# Patient Record
Sex: Male | Born: 1985 | Hispanic: Yes | Smoking: Never smoker
Health system: Southern US, Community
[De-identification: ages and names within clinical notes are randomized; demographics above are authoritative.]

---

## 2014-03-26 ENCOUNTER — Encounter (HOSPITAL_COMMUNITY): Payer: Self-pay | Admitting: Family Medicine

## 2014-03-26 ENCOUNTER — Emergency Department (HOSPITAL_COMMUNITY)
Admission: EM | Admit: 2014-03-26 | Discharge: 2014-03-26 | Disposition: A | Discharge status: Home / Self Care | Payer: Self-pay | Attending: Emergency Medicine | Admitting: Emergency Medicine

## 2014-03-26 ENCOUNTER — Emergency Department (HOSPITAL_COMMUNITY): Payer: Self-pay

## 2014-03-26 DIAGNOSIS — R079 Chest pain, unspecified: Secondary | ICD-10-CM

## 2014-03-26 DIAGNOSIS — R0789 Other chest pain: Secondary | ICD-10-CM | POA: Insufficient documentation

## 2014-03-26 LAB — BASIC METABOLIC PANEL
Anion gap: 11 (ref 5–15)
BUN: 21 mg/dL (ref 6–23)
CO2: 25 mmol/L (ref 19–32)
Calcium: 9.3 mg/dL (ref 8.4–10.5)
Chloride: 103 mmol/L (ref 96–112)
Creatinine, Ser: 1.51 mg/dL — ABNORMAL HIGH (ref 0.50–1.35)
GFR calc Af Amer: 71 mL/min — ABNORMAL LOW (ref >90)
GFR, EST NON AFRICAN AMERICAN: 61 mL/min — AB (ref >90)
Glucose, Bld: 103 mg/dL — ABNORMAL HIGH (ref 70–99)
POTASSIUM: 4 mmol/L (ref 3.5–5.1)
SODIUM: 139 mmol/L (ref 135–145)

## 2014-03-26 LAB — CBC
HEMATOCRIT: 43.5 % (ref 39.0–52.0)
Hemoglobin: 15.3 g/dL (ref 13.0–17.0)
MCH: 28.3 pg (ref 26.0–34.0)
MCHC: 35.2 g/dL (ref 30.0–36.0)
MCV: 80.4 fL (ref 78.0–100.0)
PLATELETS: 337 10*3/uL (ref 150–400)
RBC: 5.41 MIL/uL (ref 4.22–5.81)
RDW: 13 % (ref 11.5–15.5)
WBC: 10.2 10*3/uL (ref 4.0–10.5)

## 2014-03-26 LAB — I-STAT TROPONIN, ED: Troponin i, poc: 0 ng/mL (ref 0.00–0.08)

## 2014-03-26 MED ORDER — OXYCODONE-ACETAMINOPHEN 5-325 MG PO TABS: 1.0000 | ORAL_TABLET | Freq: Four times a day (QID) | ORAL | Status: AC | PRN

## 2014-03-26 MED ORDER — OXYCODONE-ACETAMINOPHEN 5-325 MG PO TABS
1.0000 | ORAL_TABLET | Freq: Once | ORAL | Status: AC
  Administered 2014-03-26: 1 via ORAL
  Filled 2014-03-26: qty 1

## 2014-03-26 NOTE — Discharge Instructions (Signed)

## 2014-03-26 NOTE — ED Provider Notes (Signed)
CSN: [id number]     Arrival date & time 03/26/14  1706 History   First MD Initiated Contact with Patient 03/26/14 1927     Chief Complaint  Patient presents with  . Chest Pain     (Consider location/radiation/quality/duration/timing/severity/associated sxs/prior Treatment) Patient is a 29 y.o. male presenting with chest pain. The history is provided by the patient.  Chest Pain Pain location:  L chest, R chest and substernal area Pain quality: aching   Pain radiates to:  Does not radiate Pain radiates to the back: no   Pain severity:  Moderate Onset quality:  Gradual Duration:  2 days Timing:  Intermittent Progression:  Unchanged Chronicity:  New Context: at rest   Relieved by: ibuprofen. Worsened by:  Certain positions Ineffective treatments:  None tried Associated symptoms: no abdominal pain, no cough, no fever, no headache, no nausea, no numbness and not vomiting     History reviewed. No pertinent past medical history. History reviewed. No pertinent past surgical history. History reviewed. No pertinent family history. History  Substance Use Topics  . Smoking status: Never Smoker   . Smokeless tobacco: Not on file  . Alcohol Use: No    Review of Systems  Constitutional: Negative for fever.  HENT: Negative for drooling and rhinorrhea.   Eyes: Negative for pain.  Respiratory: Negative for cough.   Cardiovascular: Positive for chest pain. Negative for leg swelling.  Gastrointestinal: Negative for nausea, vomiting, abdominal pain and diarrhea.  Genitourinary: Negative for dysuria and hematuria.  Musculoskeletal: Negative for gait problem and neck pain.  Skin: Negative for color change.  Neurological: Negative for numbness and headaches.  Hematological: Negative for adenopathy.  Psychiatric/Behavioral: Negative for behavioral problems.  All other systems reviewed and are negative.     Allergies  Review of patient's allergies indicates no known allergies.  Home  Medications   Prior to Admission medications   Not on File   BP 145/85 mmHg  Pulse 87  Temp(Src) 97.9 F (36.6 C) (Oral)  Resp 21  SpO2 99% Physical Exam  Constitutional: He is oriented to person, place, and time. He appears well-developed and well-nourished.  HENT:  Head: Normocephalic and atraumatic.  Right Ear: External ear normal.  Left Ear: External ear normal.  Nose: Nose normal.  Mouth/Throat: Oropharynx is clear and moist. No oropharyngeal exudate.  Eyes: Conjunctivae and EOM are normal. Pupils are equal, round, and reactive to light.  Neck: Normal range of motion. Neck supple.  Cardiovascular: Normal rate, regular rhythm, normal heart sounds and intact distal pulses.  Exam reveals no gallop and no friction rub.   No murmur heard. Pulmonary/Chest: Effort normal and breath sounds normal. No respiratory distress. He has no wheezes. He exhibits tenderness (mild tenderness to palpation of the right upper chest. Moderate tenderness to palpation of the sternum.).  Abdominal: Soft. Bowel sounds are normal. He exhibits no distension. There is no tenderness. There is no rebound and no guarding.  Musculoskeletal: Normal range of motion. He exhibits no edema or tenderness.  Normal-appearing symmetric lower extremities.  2+ distal and equal pulses in the upper extremities.  Neurological: He is alert and oriented to person, place, and time.  Skin: Skin is warm and dry.  Psychiatric: He has a normal mood and affect. His behavior is normal.  Nursing note and vitals reviewed.   ED Course  Procedures (including critical care time) Labs Review Labs Reviewed  BASIC METABOLIC PANEL - Abnormal; Notable for the following:    Glucose, Bld 103 (*)  Creatinine, Ser 1.51 (*)    GFR calc non Af Amer 61 (*)    GFR calc Af Amer 71 (*)    All other components within normal limits  CBC  I-STAT TROPOININ, ED    Imaging Review Dg Chest 2 View  03/26/2014   CLINICAL DATA:  Chest pain.  Bilateral shoulder pain. Symptoms for 2 days.  EXAM: CHEST  2 VIEW  COMPARISON:  None.  FINDINGS: Cardiopericardial silhouette within normal limits. Mediastinal contours normal. The RIGHT lung is clear. There is streaky density at the LEFT lung base. This is favored to represent atelectasis over airspace disease. No pneumothorax. No pleural fluid.  IMPRESSION: LEFT basilar density favored to represent atelectasis over airspace disease/ pneumonia.   Electronically Signed   By: Andreas Newport M.D.   On: 03/26/2014 17:41     EKG Interpretation   Date/Time:  Friday March 26 2014 17:09:59 EST Ventricular Rate:  96 PR Interval:  140 QRS Duration: 76 QT Interval:  314 QTC Calculation: 396 R Axis:   42 Text Interpretation:  Normal sinus rhythm non-spec t wave abn's Confirmed  by Laddie Math  MD, Aleysia Oltmann ([id number]) on 03/26/2014 7:41:45 PM      MDM   Final diagnoses:  Atypical chest pain    8:17 PM 29 y.o. male who presents with chest pain which began 2 days ago. He states it began in his right chest and moved to the left side and in the middle. He states that last for hours at a time and feels like an aching pain. He states that it is worse with bending over. He states that he feels short of breath when the pain gets worse. He had relief with ibuprofen last night. He denies any injuries. Vital signs unremarkable here. I interpreted/reviewed the labs and/or imaging which were non-contributory.  Wells/perc neg. Likely msk cause of pain as it is reproducible w/ bending.   8:54 PM:  I have discussed the diagnosis/risks/treatment options with the patient and believe the pt to be eligible for discharge home to follow-up with at the wellness center as needed. We also discussed returning to the ED immediately if new or worsening sx occur. We discussed the sx which are most concerning (e.g., worsening pain, sob) that necessitate immediate return. Medications administered to the patient during their visit and any  new prescriptions provided to the patient are listed below.  Medications given during this visit Medications  oxyCODONE-acetaminophen (PERCOCET/ROXICET) 5-325 MG per tablet 1 tablet (1 tablet Oral Given 03/26/14 2029)    New Prescriptions   OXYCODONE-ACETAMINOPHEN (PERCOCET) 5-325 MG PER TABLET    Take 1 tablet by mouth every 6 (six) hours as needed for moderate pain.       Purvis Sheffield, MD 03/26/14 2054

## 2014-03-26 NOTE — ED Notes (Signed)
Pt sts right sided chest pain that radiated to left side. sts started last night. sts hurts with breathing and movement.

## 2015-01-19 ENCOUNTER — Emergency Department (HOSPITAL_COMMUNITY): Payer: Self-pay

## 2015-01-19 ENCOUNTER — Encounter (HOSPITAL_COMMUNITY): Payer: Self-pay | Admitting: *Deleted

## 2015-01-19 ENCOUNTER — Emergency Department (HOSPITAL_COMMUNITY)
Admission: EM | Admit: 2015-01-19 | Discharge: 2015-01-19 | Disposition: A | Discharge status: Home / Self Care | Payer: Self-pay | Attending: Emergency Medicine | Admitting: Emergency Medicine

## 2015-01-19 DIAGNOSIS — M25462 Effusion, left knee: Secondary | ICD-10-CM | POA: Insufficient documentation

## 2015-01-19 DIAGNOSIS — M25562 Pain in left knee: Secondary | ICD-10-CM | POA: Insufficient documentation

## 2015-01-19 MED ORDER — TYLENOL 325 MG PO TABS: 650.0000 mg | ORAL_TABLET | Freq: Four times a day (QID) | ORAL | Status: AC | PRN

## 2015-01-19 MED ORDER — KETOROLAC TROMETHAMINE 30 MG/ML IJ SOLN
30.0000 mg | Freq: Once | INTRAMUSCULAR | Status: AC
  Administered 2015-01-19: 30 mg via INTRAMUSCULAR
  Filled 2015-01-19: qty 1

## 2015-01-19 MED ORDER — NAPROXEN 500 MG PO TABS: 500.0000 mg | ORAL_TABLET | Freq: Two times a day (BID) | ORAL | Status: AC

## 2015-01-19 MED ORDER — ACETAMINOPHEN 500 MG PO TABS
1000.0000 mg | ORAL_TABLET | Freq: Once | ORAL | Status: AC
  Administered 2015-01-19: 1000 mg via ORAL
  Filled 2015-01-19: qty 2

## 2015-01-19 NOTE — ED Provider Notes (Signed)
CSN: [id number]     Arrival date & time 01/19/15  1317 History  By signing my name below, I, Christopher Conway, attest that this documentation has been prepared under the direction and in the presence of Tanesia Butner, [place]. Electronically Signed: Elon Conway ED Scribe. 01/19/2015. 2:37 PM.    Chief Complaint  Patient presents with  . Knee Pain    The history is provided by the patient. No language interpreter was used.   HPI Comments: Christopher Conway is a 29 y.o. male who presents to the Emergency Department complaining of intermittent, long-term, anterior left knee pain that worsened and became constant 1 week ago without injury.  The pain is worse with extending the knee and worse in the morning.  He treated the pain initially with ibuprofen with transient relief but this treatment has become ineffective over the past week.  He has also used a knee brace with some relief.  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  He denies fever, numbness/tingling, weakness.  Denies acute injury or trauma.   History reviewed. No pertinent past medical history. History reviewed. No pertinent past surgical history. No family history on file. Social History  Substance Use Topics  . Smoking status: Never Smoker   . Smokeless tobacco: None  . Alcohol Use: No    Review of Systems  10 Systems reviewed and all are negative for acute change except as noted in the HPI.     Allergies  Review of patient's allergies indicates no known allergies.  Home Medications   Prior to Admission medications   Medication Sig Start Date End Date Taking? Authorizing Provider  oxyCODONE-acetaminophen (PERCOCET) 5-325 MG per tablet Take 1 tablet by mouth every 6 (six) hours as needed for moderate pain. 03/26/14   Purvis Sheffield, MD   BP 143/94 mmHg  Pulse 67  Temp(Src) 97.7 F (36.5 C) (Oral)  Resp 18  SpO2 100% Physical Exam  Constitutional: He is oriented to person,  place, and time. He appears well-developed and well-nourished. No distress.  HENT:  Head: Normocephalic and atraumatic.  Eyes: Conjunctivae and EOM are normal.  Neck: Neck supple. No tracheal deviation present.  Cardiovascular: Normal rate.   Pulmonary/Chest: Effort normal. No respiratory distress.  Musculoskeletal:  L knee is mildly edematous when compared to R knee though it is not red or hot. FROM. Strength and sensation intact. There is a small point of tenderness just superior to tibial tuberosity. No visible lesion or deformity. Pt is able to ambulate unassisted with steady gait.  Neurological: He is alert and oriented to person, place, and time.  Skin: Skin is warm and dry.  Psychiatric: He has a normal mood and affect. His behavior is normal.  Nursing note and vitals reviewed.   ED Course  Procedures (including critical care time)  DIAGNOSTIC STUDIES: Oxygen Saturation is 100% on RA, normal by my interpretation.    COORDINATION OF CARE:  2:37 PM Discussed treatment plan with patient at bedside.  Patient acknowledges and agrees with plan.    Labs Review Labs Reviewed - No data to display  Imaging Review Dg Knee Complete 4 Views Left  01/19/2015  CLINICAL DATA:  Left posterior knee pain radiating around to anterior knee. No known injury. EXAM: LEFT KNEE - COMPLETE 4+ VIEW COMPARISON:  None. FINDINGS: Small joint effusion. No acute bony abnormality. Specifically, no fracture, subluxation, or dislocation. Soft tissues are intact. IMPRESSION: No bony abnormality.  Small joint effusion. Electronically Signed   By: Charlett NoseKevin  Dover M.D.   On: 01/19/2015 14:29   I have personally reviewed and evaluated these images and lab results as part of my medical decision-making.   EKG Interpretation None      MDM   Final diagnoses:  Left knee pain    XR shows small joint effusion but is otherwise unremarkable. Nonfocal exam. I suspect overuse injury. He is afebrile and joint is not  hot or red. I do not suspect septic joint. Exam is not consistent with--and pt does not have history to support--acute injury such as ACL or meniscal tear. Toradol and acetaminophen given here. Will give rx for NSAIDs and acetaminophen. Will give work note. Referral to ortho given for further evaluation if symptoms persist or do not improve. ER return precautions given .  I personally performed the services described in this documentation, which was scribed in my presence. The recorded information has been reviewed and is accurate.   Carlene CoriaSerena Y Karisma Meiser, PA-C 01/19/15 2026  Bethann BerkshireJoseph Zammit, MD 01/20/15 [phone]

## 2015-01-19 NOTE — Discharge Instructions (Signed)
Your knee pain is likely an overuse injury. You may continue using your brace as needed. I will give you a work note for the next two days and give you prescriptions for some pain medicine. Please follow up with ortho within one week if your symptoms do not improve.   Take medications as prescribed. Return to the emergency room for worsening condition or new concerning symptoms. Follow up with your regular doctor. If you don't have a regular doctor use one of the numbers below to establish a primary care doctor.   Emergency Department Resource Guide 1) Find a Doctor and Pay Out of Pocket Although you won't have to find out who is covered by your insurance plan, it is a good idea to ask around and get recommendations. You will then need to call the office and see if the doctor you have chosen will accept you as a new patient and what types of options they offer for patients who are self-pay. Some doctors offer discounts or will set up payment plans for their patients who do not have insurance, but you will need to ask so you aren't surprised when you get to your appointment.  2) Contact Your Local Health Department Not all health departments have doctors that can see patients for sick visits, but many do, so it is worth a call to see if yours does. If you don't know where your local health department is, you can check in your phone book. The CDC also has a tool to help you locate your state's health department, and many state websites also have listings of all of their local health departments.  3) Find a Walk-in Clinic If your illness is not likely to be very severe or complicated, you may want to try a walk in clinic. These are popping up all over the country in pharmacies, drugstores, and shopping centers. They're usually staffed by nurse practitioners or physician assistants that have been trained to treat common illnesses and complaints. They're usually fairly quick and inexpensive. However, if you  have serious medical issues or chronic medical problems, these are probably not your best option.  No Primary Care Doctor: - Call Health Connect at  [phone] - they can help you locate a primary care doctor that  accepts your insurance, provides certain services, etc. - Physician Referral [phone]  Emergency Department Resource Guide 1) Find a Doctor and Pay Out of Pocket Although you won't have to find out who is covered by your insurance plan, it is a good idea to ask around and get recommendations. You will then need to call the office and see if the doctor you have chosen will accept you as a new patient and what types of options they offer for patients who are self-pay. Some doctors offer discounts or will set up payment plans for their patients who do not have insurance, but you will need to ask so you aren't surprised when you get to your appointment.  2) Contact Your Local Health Department Not all health departments have doctors that can see patients for sick visits, but many do, so it is worth a call to see if yours does. If you don't know where your local health department is, you can check in your phone book. The CDC also has a tool to help you locate your state's health department, and many state websites also have listings of all of their local health departments.  3) Find a Walk-in Clinic If your illness is not  likely to be very severe or complicated, you may want to try a walk in clinic. These are popping up all over the country in pharmacies, drugstores, and shopping centers. They're usually staffed by nurse practitioners or physician assistants that have been trained to treat common illnesses and complaints. They're usually fairly quick and inexpensive. However, if you have serious medical issues or chronic medical problems, these are probably not your best option.  No Primary Care Doctor: - Call Health Connect at  [phone] - they can help you locate a primary care  doctor that  accepts your insurance, provides certain services, etc. - Physician Referral Service- [phone]  Chronic Pain Problems: Organization         Address  Phone   Notes  [facility]  [phone] Patients need to be referred by their primary care doctor.   Medication Assistance: Organization         Address  Phone   Notes  [facility] Medication [facility] [address] [place] [place], [place] [address] [phone] --Must be a resident of [facility] -- Must have NO insurance coverage whatsoever (no Medicaid/ Medicare, etc.) -- The pt. MUST have a primary care doctor that directs their care regularly and follows them in the community   [facility]  [phone]   [employer]  [phone]    Agencies that provide inexpensive medical care: Organization         Address  Phone   Notes  [facility]  [phone]   [facility]    [phone]   [facility] [address] [place], [place] [address] [phone]   [facility] [address] [place], [place] [phone]   [facility]    [phone]   [facility]    [phone]   [facility]  [address], [place] Phone:  [phone], Fax:  [phone] Hours of Operation:  9 am - 6 pm, M-F.  Also accepts Medicaid/Medicare and self-pay.  [facility]  [address], [place] Phone: [phone], Fax: [phone]. Hours of Operation:  8:30 am - 5:30 pm, M-F.  Also accepts Medicaid and self-pay.  [facility] [place] [address] [place] Phone: [phone]   [facility] [address] Natasha Bence [place], [place] [phone], Ext. [phone] Mondays & Thursdays: 7-9 AM.  First 15 patients are seen on a first come, first serve basis.    Medicaid-accepting [facility]  Providers:  Organization         Address  Phone   Notes  [facility] [address], [place] [phone] Also accepts self-pay patients.  [facility] [address] Laurell Josephs [place], [place]  [phone]   [facility] [address], [place] [phone]   [facility] [address] [place] [phone]   Renaye Rakers [address], [place]   [phone] Only accepts [place] patients after they have their name applied to their card.   Self-Pay (no insurance) in [facility]:  Organization         Address  Phone   Notes  Sickle Cell Patients, [facility] [address] [place], [place] [phone]   [facility]  [facility] [address], [place] [phone]   [facility]  [address], [place] [phone]) [phone]   [facility]/Dr. Osei-Bonsu  [address] [place] [address], [place] or [phone], [place] [phone] Phone number for both [place] and [place] locations is the same.  [facility] [address], [place] [phone]   [facility] [address] [place] [address], [place] or [address] [phone]   [facility] [address], [place] [phone], phone; [phone]) [phone], fax Sees patients 1st and 3rd Saturday of every month.  Must not qualify for public or private insurance (i.e. Medicaid, Medicare, [place] Health Choice, Veterans' Benefits)  Household income should be no more than 200% of the poverty level The clinic cannot treat you if you are pregnant or think you are pregnant  Sexually transmitted diseases are not treated at the clinic.

## 2015-01-19 NOTE — ED Notes (Signed)
Pt states about one week started having left posterior knee pain and now in front of knee, worse in am.  Pulse present.  No injury.

## 2017-01-01 IMAGING — CR DG CHEST 2V
2 series · 2 of 2 positions shown · non-contrast
Comparison: None.

CLINICAL DATA: Chest pain. Bilateral shoulder pain. Symptoms for 2
days.

EXAM:
CHEST  2 VIEW

[w chest pa]
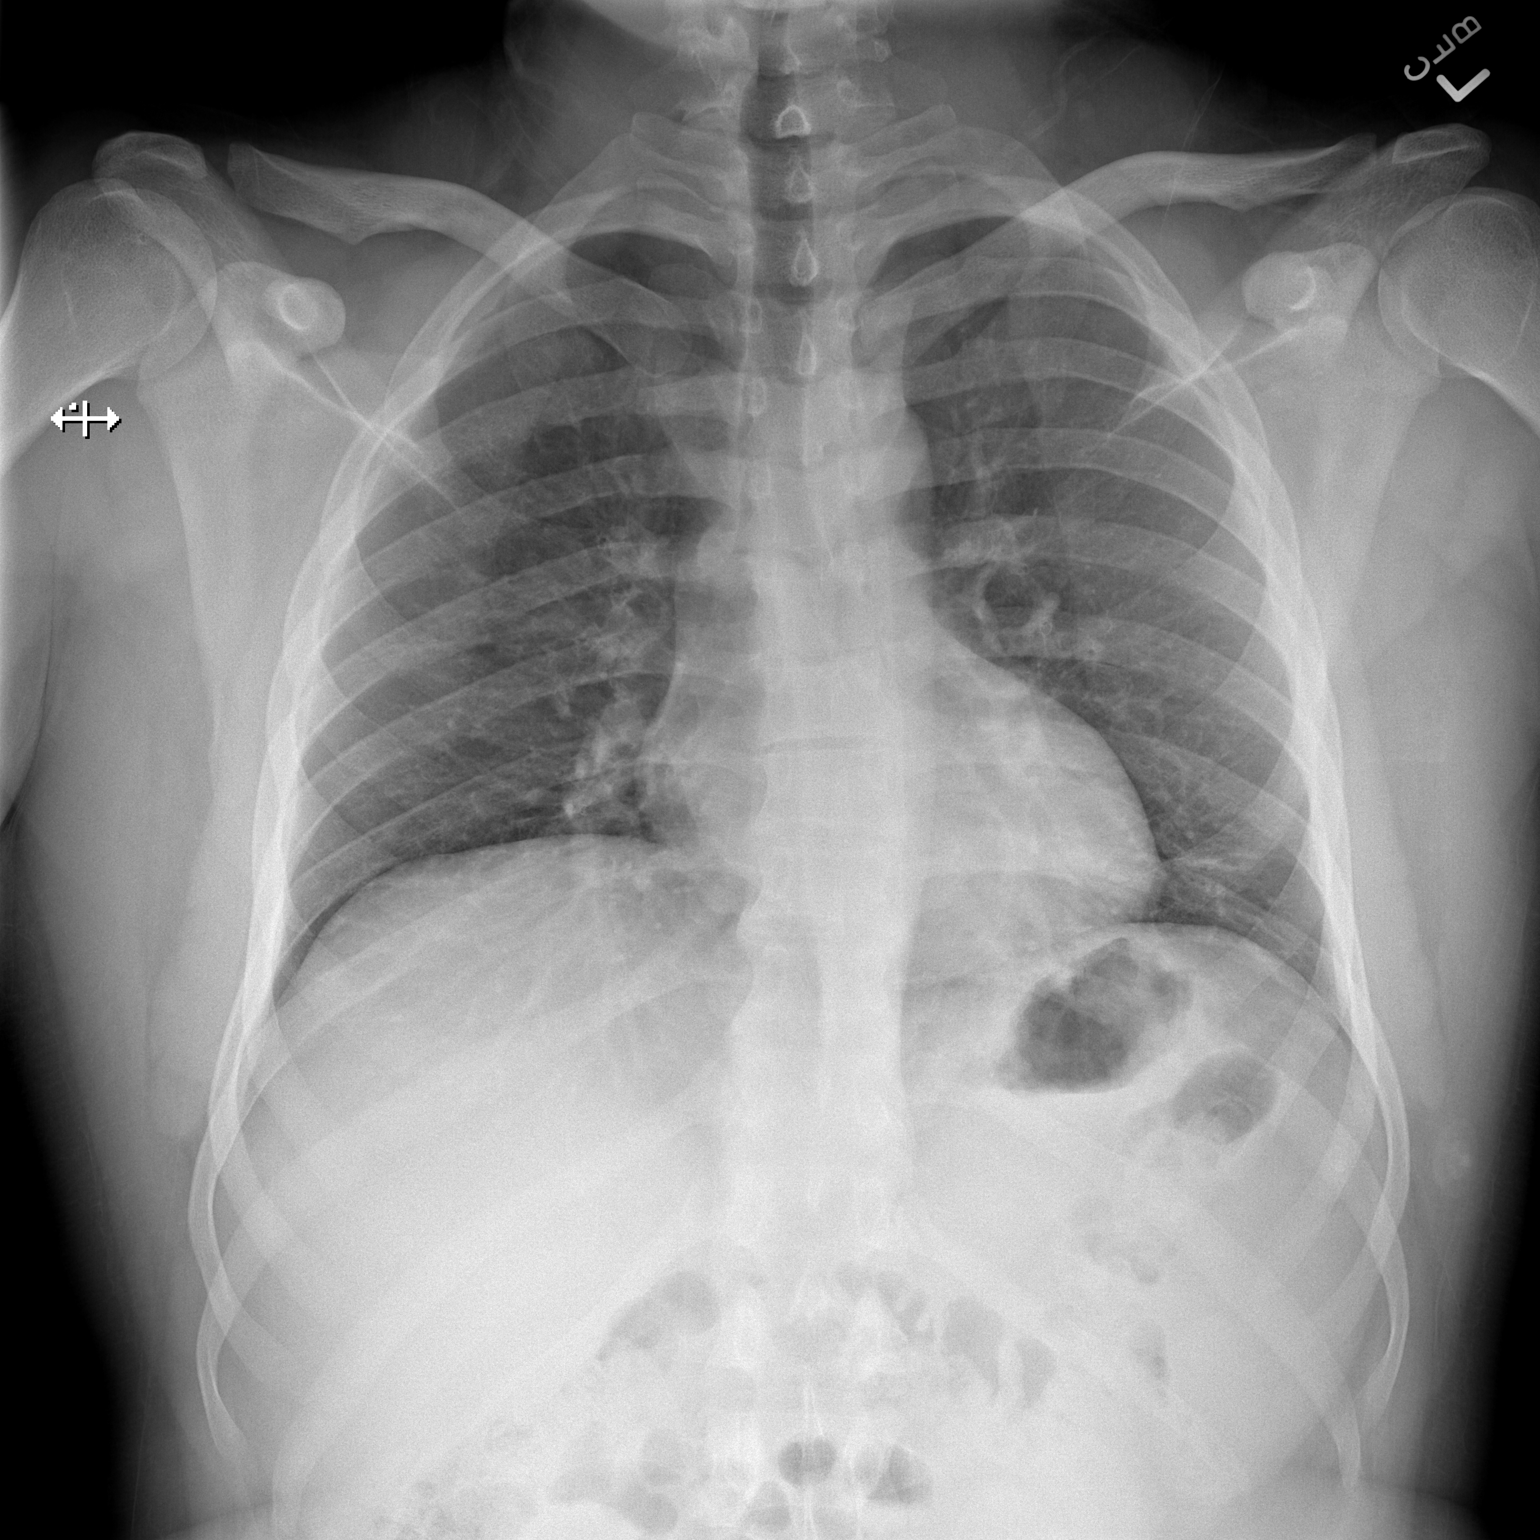

[w chest lat]
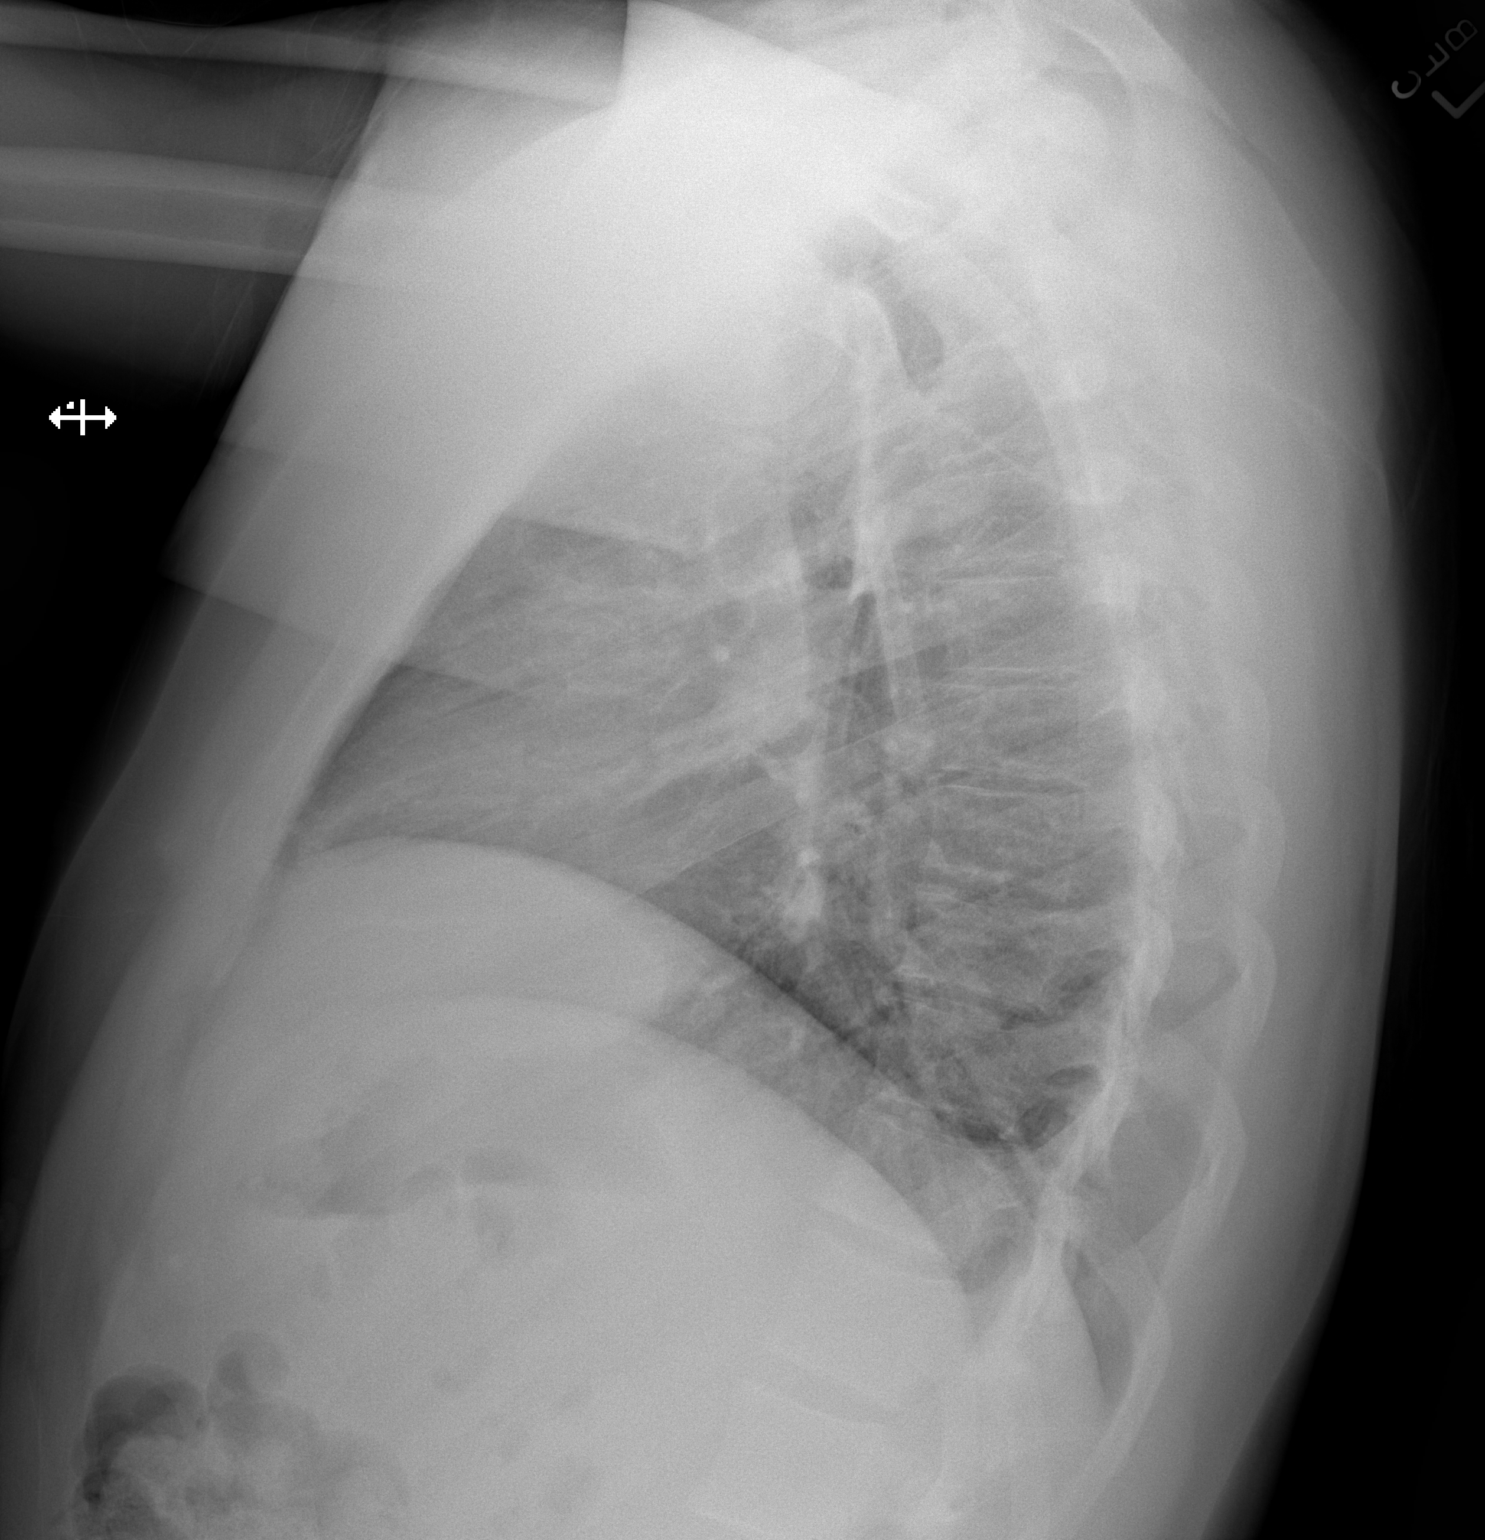

[2 of 2 positions shown; findings below may reference images not displayed]

FINDINGS: Cardiopericardial silhouette within normal limits. Mediastinal
contours normal. The RIGHT lung is clear. There is streaky density
at the LEFT lung base. This is favored to represent atelectasis over
airspace disease. No pneumothorax. No pleural fluid.
IMPRESSION: LEFT basilar density favored to represent atelectasis over airspace
disease/ pneumonia.

## 2017-10-27 IMAGING — DX DG KNEE COMPLETE 4+V*L*
4 series · 4 of 4 positions shown · non-contrast
Comparison: None.

CLINICAL DATA: Left posterior knee pain radiating around to
anterior knee. No known injury.

EXAM:
LEFT KNEE - COMPLETE 4+ VIEW

[knee ap]
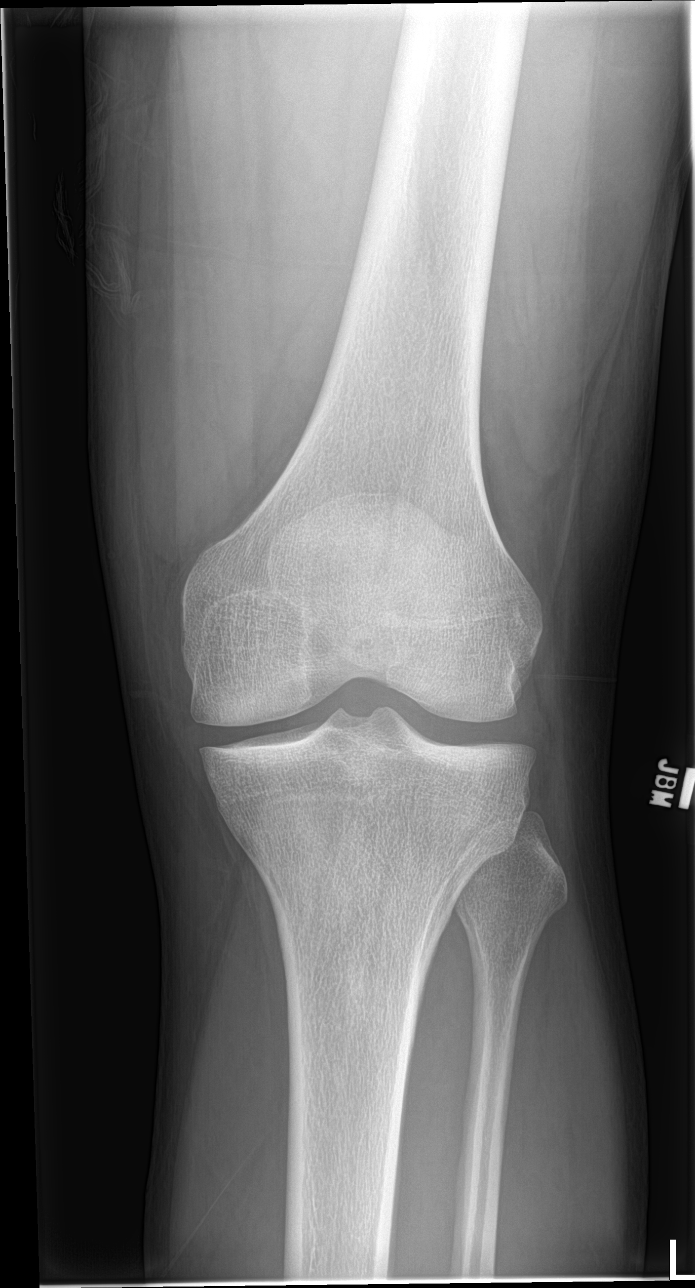

[knee lat]
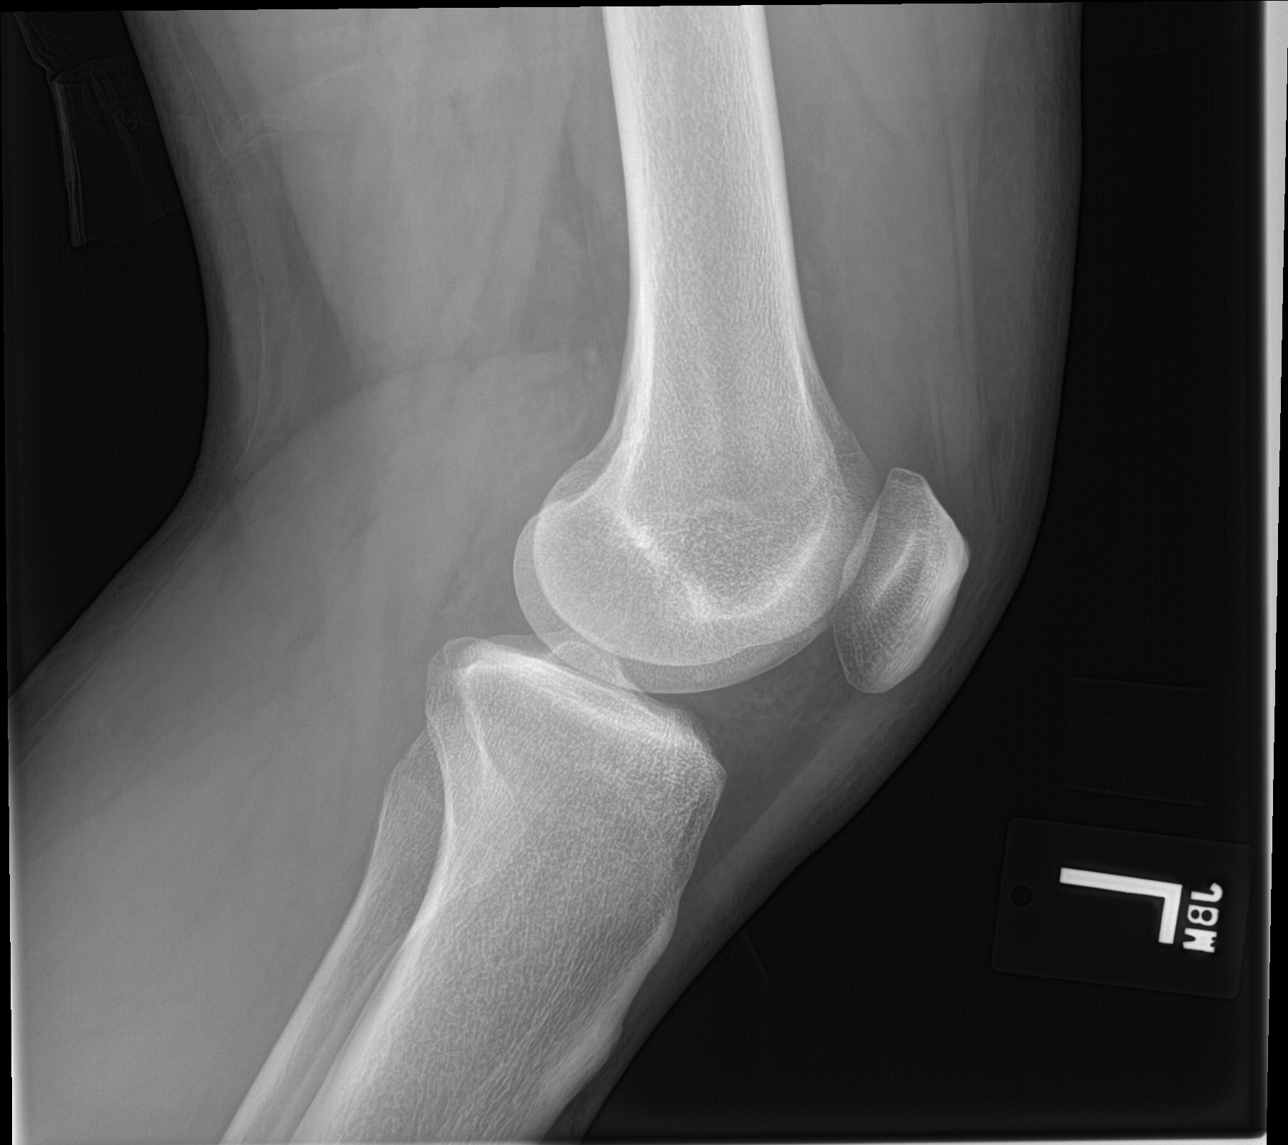

[knee obl (1 of 2)]
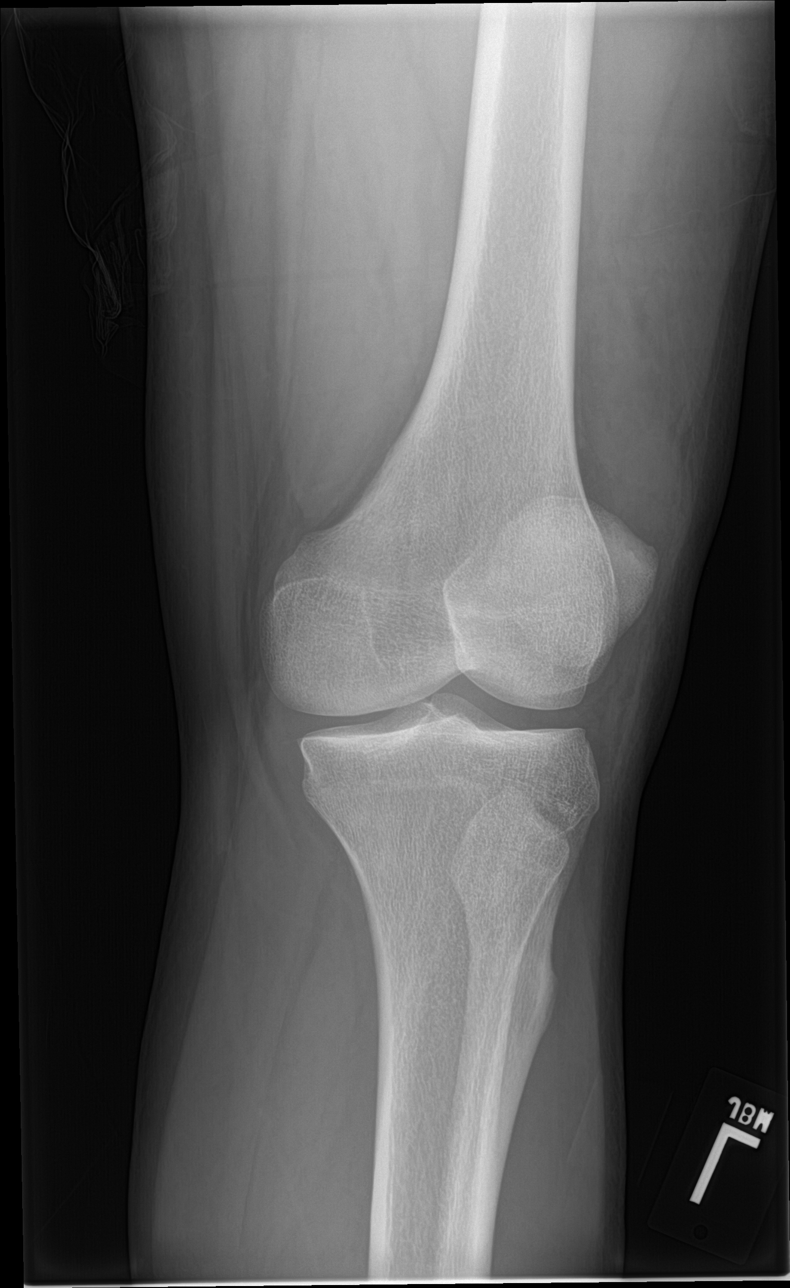

[knee obl (2 of 2)]
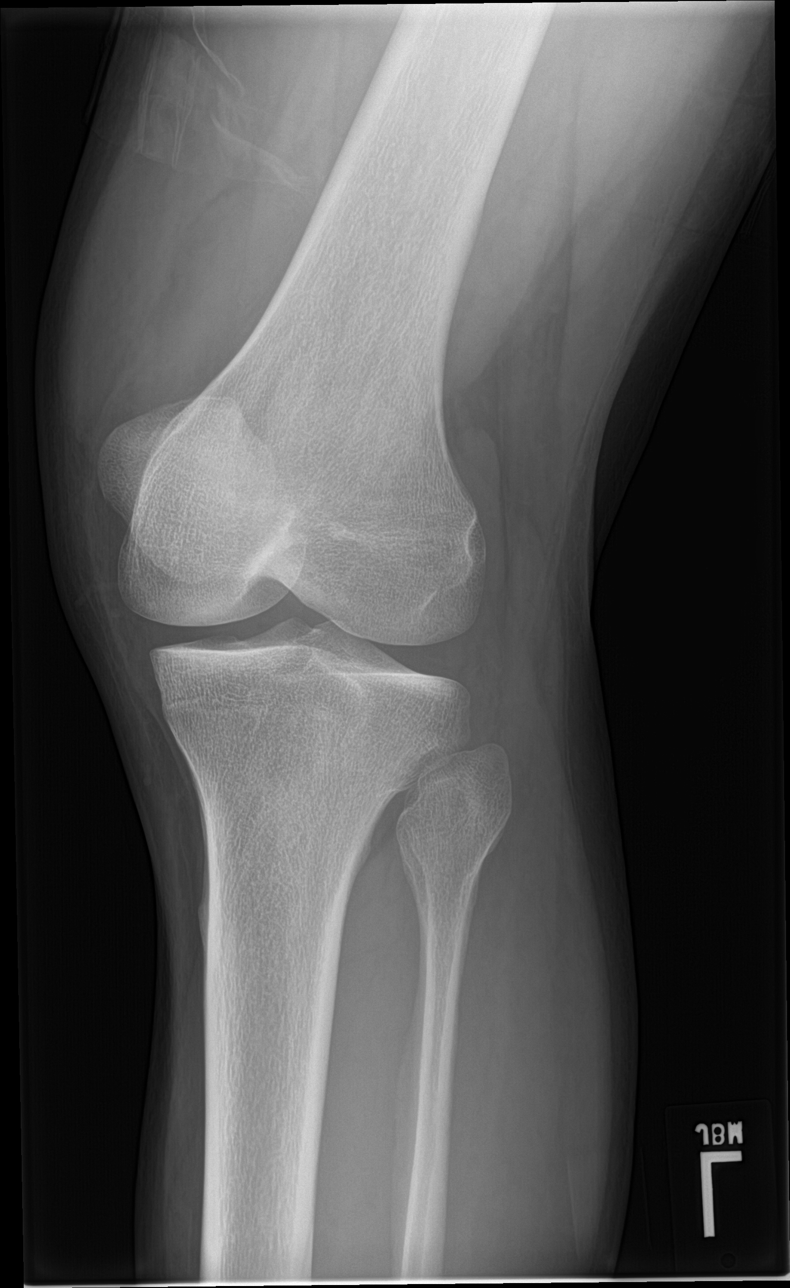

[4 of 4 positions shown; findings below may reference images not displayed]

FINDINGS: Small joint effusion. No acute bony abnormality. Specifically, no
fracture, subluxation, or dislocation. Soft tissues are intact.
IMPRESSION: No bony abnormality.  Small joint effusion.
